# Patient Record
Sex: Female | Born: 2005
Health system: Southern US, Community
[De-identification: ages and names within clinical notes are randomized; demographics above are authoritative.]

---
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
## 2023-02-20 ENCOUNTER — Ambulatory Visit
Admission: EM | Admit: 2023-02-20 | Discharge: 2023-02-20 | Disposition: A | Discharge status: Home / Self Care | Payer: BC Managed Care – PPO | Attending: Urgent Care | Admitting: Urgent Care

## 2023-02-20 DIAGNOSIS — J029 Acute pharyngitis, unspecified: Secondary | ICD-10-CM | POA: Diagnosis not present

## 2023-02-20 LAB — POCT RAPID STREP A (OFFICE): Rapid Strep A Screen: NEGATIVE

## 2023-02-20 NOTE — ED Triage Notes (Signed)
Patient to Urgent Care with complaints of sore throat that started two days ago. Reports the left side of her throat has felt swollen and tender to the touch. Has had some post nasal drip. Some body aches.   Denies any known fevers- states she felt warm last night.

## 2023-02-20 NOTE — Discharge Instructions (Addendum)
Follow up here or with your primary care provider if your symptoms are worsening or not improving.     

## 2023-02-20 NOTE — ED Provider Notes (Signed)
Isabella Stephenson    CSN: [id number] Arrival date & time: 02/20/23  1053      History   Chief Complaint Chief Complaint  Patient presents with   Sore Throat    HPI Isabella Stephenson is a 17 y.o. female.    Sore Throat    Presents to urgent care with complaint of sore throat starting 2 days ago.  Reports swelling and tenderness on the left side of her throat.  Endorses some postnasal drip, body aches.  Denies known fevers but felt "warm".  History reviewed. No pertinent past medical history.  Patient Active Problem List   Diagnosis Date Noted   GAD (generalized anxiety disorder) 01/13/2022    History reviewed. No pertinent surgical history.  OB History   No obstetric history on file.      Home Medications    Prior to Admission medications   Medication Sig Start Date End Date Taking? Authorizing Provider  acetaminophen (TYLENOL) 325 MG tablet Take 2 tablets (650 mg total) by mouth every 6 (six) hours as needed for moderate pain. 12/14/21   Jaynee Eagles, PA-C  escitalopram (LEXAPRO) 20 MG tablet Take 1 tablet (20 mg total) by mouth daily. 12/01/22   Jearld Fenton, NP  fluticasone (FLONASE) 50 MCG/ACT nasal spray Place 2 sprays into both nostrils daily. 01/17/22   Melynda Ripple, MD  ibuprofen (ADVIL) 600 MG tablet Take 1 tablet (600 mg total) by mouth every 6 (six) hours as needed. 01/17/22   Melynda Ripple, MD  Ronnette Juniper FE 1/20 1-20 MG-MCG tablet Take 1 tablet by mouth once daily 08/01/22   Jearld Fenton, NP  Probiotic Product (PROBIOTIC-10 PO) Take by mouth.    [provider]    Family History Family History  Problem Relation Age of Onset   Healthy Mother    Healthy Father    Breast cancer Maternal Grandmother        72s   Hypertension Maternal Grandmother    Atrial fibrillation Maternal Grandfather    Healthy Paternal Grandmother    Drug abuse Paternal Grandfather     Social History Social History   Tobacco Use   Smoking status: Never    Smokeless tobacco: Never  Vaping Use   Vaping Use: Never used  Substance Use Topics   Alcohol use: Never   Drug use: Never     Allergies   Cephalosporins, Nitrofurantoin, and Penicillins   Review of Systems Review of Systems   Physical Exam Triage Vital Signs ED Triage Vitals  Enc Vitals Group     BP 02/20/23 1134 100/69     Pulse Rate 02/20/23 1127 87     Resp 02/20/23 1127 18     Temp 02/20/23 1127 98.8 F (37.1 C)     Temp src --      SpO2 02/20/23 1127 96 %     Weight 02/20/23 1131 130 lb (59 kg)     Height 02/20/23 1131 5\' 2"  (1.575 m)     Head Circumference --      Peak Flow --      Pain Score 02/20/23 1127 7     Pain Loc --      Pain Edu? --      Excl. in [place]? --    No data found.  Updated Vital Signs BP 100/69   Pulse 87   Temp 98.8 F (37.1 C)   Resp 18   Ht 5\' 2"  (1.575 m)   Wt 130 lb (59  kg)   LMP 02/11/2023   SpO2 96%   BMI 23.78 kg/m   Visual Acuity Right Eye Distance:   Left Eye Distance:   Bilateral Distance:    Right Eye Near:   Left Eye Near:    Bilateral Near:     Physical Exam Vitals reviewed.  Constitutional:      Appearance: She is well-developed. She is ill-appearing.  HENT:     Mouth/Throat:     Mouth: Mucous membranes are moist.     Pharynx: Posterior oropharyngeal erythema present. No oropharyngeal exudate.     Tonsils: No tonsillar exudate. 2+ on the right. 2+ on the left.  Lymphadenopathy:     Cervical: Cervical adenopathy present.     Left cervical: Superficial cervical adenopathy present.  Skin:    General: Skin is warm and dry.  Neurological:     General: No focal deficit present.     Mental Status: She is alert and oriented to person, place, and time.  Psychiatric:        Mood and Affect: Mood normal.        Behavior: Behavior normal.      UC Treatments / Results  Labs (all labs ordered are listed, but only abnormal results are displayed) Labs Reviewed  POCT RAPID STREP A (OFFICE)     EKG   Radiology No results found.  Procedures Procedures (including critical care time)  Medications Ordered in UC Medications - No data to display  Initial Impression / Assessment and Plan / UC Course  I have reviewed the triage vital signs and the nursing notes.  Pertinent labs & imaging results that were available during my care of the patient were reviewed by me and considered in my medical decision making (see chart for details).   Patient is afebrile here without recent antipyretics. Satting well on room air. Overall is well appearing, well hydrated, without respiratory distress. Pulmonary exam is unremarkable.  Lungs CTAB without wheezing, rhonchi, rales.  Strep is negative.  Symptoms are consistent with an acute viral process.  Recommending supportive care for symptoms.  Patient acknowledges understanding and agreement of treatment plan.   Final Clinical Impressions(s) / UC Diagnoses   Final diagnoses:  None   Discharge Instructions   None    ED Prescriptions   None    PDMP not reviewed this encounter.   Rose Phi, [place] 02/20/23 1151

## 2023-03-14 ENCOUNTER — Encounter: Payer: Self-pay | Admitting: Internal Medicine

## 2023-03-15 NOTE — Telephone Encounter (Signed)
Can we request her records from [facility]

## 2023-03-15 NOTE — Telephone Encounter (Signed)
Please see the MyChart message reply(ies) for my assessment and plan.    This patient gave consent for this Medical Advice Message and is aware that it may result in a bill to their insurance company, as well as the possibility of receiving a bill for a co-payment or deductible. They are an established patient, but are not seeking medical advice exclusively about a problem treated during an in person or video visit in the last seven days. I did not recommend an in person or video visit within seven days of my reply.    I spent a total of 2 minutes cumulative time within 7 days through MyChart messaging.  [place] Frieden, NP   

## 2023-05-10 ENCOUNTER — Encounter: Payer: Self-pay | Admitting: Internal Medicine

## 2023-05-10 ENCOUNTER — Telehealth (INDEPENDENT_AMBULATORY_CARE_PROVIDER_SITE_OTHER): Payer: BC Managed Care – PPO | Admitting: Internal Medicine

## 2023-05-10 DIAGNOSIS — F411 Generalized anxiety disorder: Secondary | ICD-10-CM | POA: Diagnosis not present

## 2023-05-10 NOTE — Assessment & Plan Note (Signed)
Discussed weaning off escitalopram Support offered

## 2023-05-10 NOTE — Patient Instructions (Signed)

## 2023-05-10 NOTE — Progress Notes (Signed)
Virtual Visit via Video Note  I connected with Isabella Stephenson on 05/10/23 at 11:20 AM EDT by a video enabled telemedicine application and verified that I am speaking with the correct person using two identifiers.  Location: Patient: Home Provider: Office  Persons participating in this video call: Nicki Reaper, NP and Wallace Going   I discussed the limitations of evaluation and management by telemedicine and the availability of in person appointments. The patient expressed understanding and agreed to proceed.  History of Present Illness:  Patient would like to discuss current anxiety.  This is currently managed on escitalopram but she does not feel like she needs this anymore.  She would like to wean off of this medication.  She is not currently seeing a therapist.  She denies depression, SI/HI.  No past medical history on file.  Current Outpatient Medications  Medication Sig Dispense Refill   acetaminophen (TYLENOL) 325 MG tablet Take 2 tablets (650 mg total) by mouth every 6 (six) hours as needed for moderate pain. 30 tablet 0   escitalopram (LEXAPRO) 20 MG tablet Take 1 tablet (20 mg total) by mouth daily. 90 tablet 1   fluticasone (FLONASE) 50 MCG/ACT nasal spray Place 2 sprays into both nostrils daily. 16 g 0   ibuprofen (ADVIL) 600 MG tablet Take 1 tablet (600 mg total) by mouth every 6 (six) hours as needed. 30 tablet 0   LARIN FE 1/20 1-20 MG-MCG tablet Take 1 tablet by mouth once daily 84 tablet 3   Probiotic Product (PROBIOTIC-10 PO) Take by mouth.     No current facility-administered medications for this visit.    Allergies  Allergen Reactions   Cephalosporins Hives   Nitrofurantoin Other (See Comments)    Other reaction(s): Other (See Comments) Flu-like symptoms, feels achy Flu-like symptoms, feels achy    Penicillins Hives    Family History  Problem Relation Age of Onset   Healthy Mother    Healthy Father    Breast cancer Maternal Grandmother        29s    Hypertension Maternal Grandmother    Atrial fibrillation Maternal Grandfather    Healthy Paternal Grandmother    Drug abuse Paternal Grandfather     Social History   Socioeconomic History   Marital status: Single    Spouse name: Not on file   Number of children: Not on file   Years of education: Not on file   Highest education level: Not on file  Occupational History   Not on file  Tobacco Use   Smoking status: Never   Smokeless tobacco: Never  Vaping Use   Vaping Use: Never used  Substance and Sexual Activity   Alcohol use: Never   Drug use: Never   Sexual activity: Not on file  Other Topics Concern   Not on file  Social History Narrative   Not on file   Social Determinants of Health   Financial Resource Strain: Not on file  Food Insecurity: Not on file  Transportation Needs: Not on file  Physical Activity: Not on file  Stress: Not on file  Social Connections: Not on file  Intimate Partner Violence: Not on file     Constitutional: Denies fever, malaise, fatigue, headache or abrupt weight changes.  HEENT: Denies eye pain, eye redness, ear pain, ringing in the ears, wax buildup, runny nose, nasal congestion, bloody nose, or sore throat. Respiratory: Denies difficulty breathing, shortness of breath, cough or sputum production.   Cardiovascular: Denies chest pain, chest  tightness, palpitations or swelling in the hands or feet.  Gastrointestinal: Denies abdominal pain, bloating, constipation, diarrhea or blood in the stool.  GU: Denies urgency, frequency, pain with urination, burning sensation, blood in urine, odor or discharge. Musculoskeletal: Denies decrease in range of motion, difficulty with gait, muscle pain or joint pain and swelling.  Skin: Denies redness, rashes, lesions or ulcercations.  Neurological: Denies dizziness, difficulty with memory, difficulty with speech or problems with balance and coordination.  Psych: Patient has a history of anxiety.  Denies  depression, SI/HI.  No other specific complaints in a complete review of systems (except as listed in HPI above).    Observations/Objective:   Wt Readings from Last 3 Encounters:  02/20/23 130 lb (59 kg) (60 %, Z= 0.24)*  12/01/22 132 lb (59.9 kg) (64 %, Z= 0.35)*  06/09/22 127 lb (57.6 kg) (57 %, Z= 0.18)*   * Growth percentiles are based on CDC (Girls, 2-20 Years) data.    General: Appears her stated age, well developed, well nourished in NAD. Pulmonary/Chest: Normal effort. No respiratory distress.  Neurological: Alert and oriented.  Psychiatric: Mood and affect normal. Behavior is normal. Judgment and thought content normal.     BMET    Component Value Date/Time   NA 139 12/14/2021 1702   K 4.0 12/14/2021 1702   CL 105 12/14/2021 1702   CO2 19 (L) 12/14/2021 1702   GLUCOSE 87 12/14/2021 1702   BUN 8 12/14/2021 1702   CREATININE 0.62 12/14/2021 1702   CALCIUM 9.5 12/14/2021 1702    Lipid Panel  No results found for: "CHOL", "TRIG", "HDL", "CHOLHDL", "VLDL", "LDLCALC"  CBC    Component Value Date/Time   WBC 8.9 12/14/2021 1702   WBC 6.8 10/29/2020 1546   RBC 4.51 12/14/2021 1702   RBC 4.52 10/29/2020 1546   HGB 13.8 12/14/2021 1702   HCT 40.3 12/14/2021 1702   PLT 336 12/14/2021 1702   MCV 89 12/14/2021 1702   MCH 30.6 12/14/2021 1702   MCH 30.5 10/29/2020 1546   MCHC 34.2 12/14/2021 1702   MCHC 34.6 10/29/2020 1546   RDW 11.9 12/14/2021 1702    Hgb A1C No results found for: "HGBA1C"     Assessment and Plan:   Schedule an appointment for annual exam  Follow Up Instructions:    I discussed the assessment and treatment plan with the patient. The patient was provided an opportunity to ask questions and all were answered. The patient agreed with the plan and demonstrated an understanding of the instructions.   The patient was advised to call back or seek an in-person evaluation if the symptoms worsen or if the condition fails to improve as  anticipated.    Nicki Reaper, NP

## 2023-07-04 ENCOUNTER — Other Ambulatory Visit: Payer: Self-pay | Admitting: Internal Medicine

## 2023-07-05 NOTE — Telephone Encounter (Signed)
Rx 08/02/23 #84 [id number]- too soon Requested Prescriptions  Pending Prescriptions Disp Refills   LARIN FE 1/20 1-20 MG-MCG tablet [Pharmacy Med Name: Randall Hiss 1/20 1-20 MG-MCG Oral Tablet] 84 tablet 0    Sig: Take 1 tablet by mouth once daily     OB/GYN:  Contraceptives Passed - 07/04/2023  6:41 AM      Passed - Last BP in normal range    BP Readings from Last 1 Encounters:  02/20/23 100/69         Passed - Valid encounter within last 12 months    Recent Outpatient Visits           1 month ago GAD (generalized anxiety disorder)   [place] [facility] [place], Salvadore [place], NP   7 months ago GAD (generalized anxiety disorder)   [place] [facility] [place], Salvadore Oxford, NP   1 year ago Nevus of female breast   [place] [facility] [place], Salvadore Oxford, NP   1 year ago GAD (generalized anxiety disorder)   [place] [facility] [place], Salvadore Oxford, NP   1 year ago Encounter for routine child health examination without abnormal findings   [place] [facility] [place], Salvadore Oxford, [place]              Passed - Patient is not a smoker

## 2023-07-07 ENCOUNTER — Encounter: Payer: Self-pay | Admitting: Internal Medicine

## 2023-07-09 MED ORDER — LARIN FE 1/20 1-20 MG-MCG PO TABS: 1.0000 | ORAL_TABLET | Freq: Every day | ORAL | 1 refills | Status: DC

## 2023-09-06 ENCOUNTER — Encounter: Payer: Self-pay | Admitting: Internal Medicine

## 2023-11-21 ENCOUNTER — Other Ambulatory Visit: Payer: Self-pay | Admitting: Medical Genetics

## 2023-11-23 ENCOUNTER — Ambulatory Visit (INDEPENDENT_AMBULATORY_CARE_PROVIDER_SITE_OTHER): Payer: BC Managed Care – PPO | Admitting: Internal Medicine

## 2023-11-23 ENCOUNTER — Encounter: Payer: Self-pay | Admitting: Internal Medicine

## 2023-11-23 VITALS — BP 102/68 | Ht 62.0 in | Wt 133.4 lb

## 2023-11-23 DIAGNOSIS — Z0000 Encounter for general adult medical examination without abnormal findings: Secondary | ICD-10-CM | POA: Diagnosis not present

## 2023-11-23 NOTE — Patient Instructions (Signed)

## 2023-11-23 NOTE — Progress Notes (Signed)
 Subjective:    Patient ID: Isabella Stephenson, female    DOB: 11-Jul-2006, 18 y.o.   MRN: [id number]  HPI  Patient presents to clinic today for her annual exam.  Flu: 09/2023 Tetanus: 10/2016 COVID: X 2 Dentist: biannually  Diet: She does eat meat. She consumes fruits and veggies. She does eat some fried foods. She drinks mostly water. Exercise: walks  Review of Systems   No past medical history on file.  Current Outpatient Medications  Medication Sig Dispense Refill   acetaminophen  (TYLENOL ) 325 MG tablet Take 2 tablets (650 mg total) by mouth every 6 (six) hours as needed for moderate pain. 30 tablet 0   fluticasone  (FLONASE ) 50 MCG/ACT nasal spray Place 2 sprays into both nostrils daily. 16 g 0   ibuprofen  (ADVIL ) 600 MG tablet Take 1 tablet (600 mg total) by mouth every 6 (six) hours as needed. 30 tablet 0   LARIN  FE 1/20 1-20 MG-MCG tablet Take 1 tablet by mouth daily. 84 tablet 1   Probiotic Product (PROBIOTIC-10 PO) Take by mouth.     No current facility-administered medications for this visit.    Allergies  Allergen Reactions   Cephalosporins Hives   Nitrofurantoin Other (See Comments)    Other reaction(s): Other (See Comments) Flu-like symptoms, feels achy Flu-like symptoms, feels achy    Penicillins Hives    Family History  Problem Relation Age of Onset   Healthy Mother    Healthy Father    Breast cancer Maternal Grandmother        61s   Hypertension Maternal Grandmother    Atrial fibrillation Maternal Grandfather    Healthy Paternal Grandmother    Drug abuse Paternal Grandfather     Social History   Socioeconomic History   Marital status: Significant Other    Spouse name: Not on file   Number of children: Not on file   Years of education: Not on file   Highest education level: Not on file  Occupational History   Not on file  Tobacco Use   Smoking status: Never   Smokeless tobacco: Never  Vaping Use   Vaping status: Never Used  Substance  and Sexual Activity   Alcohol use: Never   Drug use: Never   Sexual activity: Not on file  Other Topics Concern   Not on file  Social History Narrative   Not on file   Social Drivers of Health   Financial Resource Strain: Not on file  Food Insecurity: Not on file  Transportation Needs: Not on file  Physical Activity: Not on file  Stress: Not on file  Social Connections: Not on file  Intimate Partner Violence: Not on file     Constitutional: Denies fever, malaise, fatigue, headache or abrupt weight changes.  HEENT: Denies eye pain, eye redness, ear pain, ringing in the ears, wax buildup, runny nose, nasal congestion, bloody nose, or sore throat. Respiratory: Denies difficulty breathing, shortness of breath, cough or sputum production.   Cardiovascular: Denies chest pain, chest tightness, palpitations or swelling in the hands or feet.  Gastrointestinal: Denies abdominal pain, bloating, constipation, diarrhea or blood in the stool.  GU: Denies urgency, frequency, pain with urination, burning sensation, blood in urine, odor or discharge. Musculoskeletal: Denies decrease in range of motion, difficulty with gait, muscle pain or joint pain and swelling.  Skin: Pt reports bump of scalp. Denies redness, rashes, or ulcercations.  Neurological: Denies dizziness, difficulty with memory, difficulty with speech or problems with balance and coordination.  Psych: Patient has a history of anxiety.  Denies depression, SI/HI.  No other specific complaints in a complete review of systems (except as listed in HPI above).      Objective:   Physical Exam  BP 102/68 (BP Location: Left Arm, Patient Position: Sitting, Cuff Size: Normal)   Ht 5' 2 (1.575 m)   Wt 133 lb 6.4 oz (60.5 kg)   LMP 11/13/2023 (Approximate)   BMI 24.40 kg/m   Wt Readings from Last 3 Encounters:  02/20/23 130 lb (59 kg) (60%, Z= 0.24)*  12/01/22 132 lb (59.9 kg) (64%, Z= 0.35)*  06/09/22 127 lb (57.6 kg) (57%, Z=  0.18)*   * Growth percentiles are based on CDC (Girls, 2-20 Years) data.    General: Appears her stated age, well developed, well nourished in NAD. Skin: Warm, dry and intact. No rashes, lesions or ulcerations noted. Cyst noted under the scalp at the hairline on the right frontal aspect of the scalp. HEENT: Head: normal shape and size; Eyes: sclera white, no icterus, conjunctiva pink, PERRLA and EOMs intact.  Neck:  Neck supple, trachea midline. No masses, lumps or thyromegaly present.  Cardiovascular: Normal rate and rhythm. S1,S2 noted.  No murmur, rubs or gallops noted. No JVD or BLE edema.  Pulmonary/Chest: Normal effort and positive vesicular breath sounds. No respiratory distress. No wheezes, rales or ronchi noted.  Abdomen: Soft and nontender. Normal bowel sounds.  Musculoskeletal: Strength 5/5 BUE/BLE. No difficulty with gait.  Neurological: Alert and oriented. Cranial nerves II-XII grossly intact. Coordination normal.  Psychiatric: Mood and affect normal. Behavior is normal. Judgment and thought content normal.    BMET    Component Value Date/Time   NA 139 12/14/2021 1702   K 4.0 12/14/2021 1702   CL 105 12/14/2021 1702   CO2 19 (L) 12/14/2021 1702   GLUCOSE 87 12/14/2021 1702   BUN 8 12/14/2021 1702   CREATININE 0.62 12/14/2021 1702   CALCIUM 9.5 12/14/2021 1702    Lipid Panel  No results found for: CHOL, TRIG, HDL, CHOLHDL, VLDL, LDLCALC  CBC    Component Value Date/Time   WBC 8.9 12/14/2021 1702   WBC 6.8 10/29/2020 1546   RBC 4.51 12/14/2021 1702   RBC 4.52 10/29/2020 1546   HGB 13.8 12/14/2021 1702   HCT 40.3 12/14/2021 1702   PLT 336 12/14/2021 1702   MCV 89 12/14/2021 1702   MCH 30.6 12/14/2021 1702   MCH 30.5 10/29/2020 1546   MCHC 34.2 12/14/2021 1702   MCHC 34.6 10/29/2020 1546   RDW 11.9 12/14/2021 1702    Hgb A1C No results found for: HGBA1C          Assessment & Plan:    Preventative health maintenance:  Flu shot  UTD Tetanus UTD Encouraged her to get her COVID booster Encouraged her to consume a balanced diet and exercise regimen Advised her to see an eye doctor and dentist annually She declines labs today  RTC in 1 year, sooner if needed Angeline Laura, NP

## 2023-12-18 ENCOUNTER — Telehealth: Payer: BC Managed Care – PPO

## 2023-12-18 ENCOUNTER — Encounter: Payer: Self-pay | Admitting: Family Medicine

## 2023-12-18 DIAGNOSIS — R6889 Other general symptoms and signs: Secondary | ICD-10-CM | POA: Diagnosis not present

## 2023-12-18 MED ORDER — OSELTAMIVIR PHOSPHATE 75 MG PO CAPS: 75.0000 mg | ORAL_CAPSULE | Freq: Two times a day (BID) | ORAL | 0 refills | Status: AC

## 2023-12-18 NOTE — Patient Instructions (Addendum)
Isabella Stephenson, thank you for joining Freddy Finner, NP for today's virtual visit.  While this provider is not your primary care provider (PCP), if your PCP is located in our provider database this encounter information will be shared with them immediately following your visit.   A [facility] MyChart account gives you access to today's visit and all your visits, tests, and labs performed at [facility] " click here if you don't have a [facility] MyChart account or go to [email or web address]  Consent: (Patient) Isabella Stephenson provided verbal consent for this virtual visit at the beginning of the encounter.  Current Medications:  Current Outpatient Medications:    oseltamivir (TAMIFLU) 75 MG capsule, Take 1 capsule (75 mg total) by mouth 2 (two) times daily for 5 days., Disp: 10 capsule, Rfl: 0   acetaminophen (TYLENOL) 325 MG tablet, Take 2 tablets (650 mg total) by mouth every 6 (six) hours as needed for moderate pain., Disp: 30 tablet, Rfl: 0   fluticasone (FLONASE) 50 MCG/ACT nasal spray, Place 2 sprays into both nostrils daily., Disp: 16 g, Rfl: 0   ibuprofen (ADVIL) 600 MG tablet, Take 1 tablet (600 mg total) by mouth every 6 (six) hours as needed., Disp: 30 tablet, Rfl: 0   LARIN FE 1/20 1-20 MG-MCG tablet, Take 1 tablet by mouth daily., Disp: 84 tablet, Rfl: 1   Probiotic Product (PROBIOTIC-10 PO), Take by mouth., Disp: , Rfl:    Medications ordered in this encounter:  Meds ordered this encounter  Medications   oseltamivir (TAMIFLU) 75 MG capsule    Sig: Take 1 capsule (75 mg total) by mouth 2 (two) times daily for 5 days.    Dispense:  10 capsule    Refill:  0    Supervising Provider:   Merrilee Jansky [[id number]]     *If you need refills on other medications prior to your next appointment, please contact your pharmacy*  Follow-Up: Call back or seek an in-person evaluation if the symptoms worsen or if the condition fails to improve as  anticipated.   Virtual Care [phone]  Other Instructions  Influenza, Adult Influenza is also called the flu. It's an infection that affects your respiratory tract. This includes your nose, throat, windpipe, and lungs. The flu is contagious. This means it spreads easily from person to person. It causes symptoms that are like a cold. It can also cause a high fever and body aches. What are the causes? The flu is caused by the influenza virus. You can get it by: Breathing in droplets that are in the air after an infected person coughs or sneezes. Touching something that has the virus on it and then touching your mouth, nose, or eyes. What increases the risk? You may be more likely to get the flu if: You don't wash your hands often. You're near a lot of people during cold and flu season. You touch your mouth, eyes, or nose without washing your hands first. You don't get a flu shot each year. You may also be more at risk for the flu and serious problems, such as a lung infection called pneumonia, if: You're older than 65. You're pregnant. Your immune system is weak. Your immune system is your body's defense system. You have a long-term, or chronic, condition, such as: Heart, kidney, or lung disease. Diabetes. A liver disorder. Asthma. You're very overweight. You have anemia. This is when you don't have enough red blood cells in your body. What are  the signs or symptoms? Flu symptoms often start all of a sudden. They may last 4-14 days and include: Fever and chills. Headaches, body aches, or muscle aches. Sore throat. Cough. Runny or stuffy nose. Discomfort in your chest. Not wanting to eat as much as normal. Feeling weak or tired. Feeling dizzy. Nausea or vomiting. How is this diagnosed? The flu may be diagnosed based on your symptoms and medical history. You may also have a physical exam. A swab may be taken from your nose or throat and tested for the  virus. How is this treated? If the flu is found early, you can be treated with antiviral medicine. This may be given to you by mouth or through an IV. It can help you feel less sick and get better faster. Taking care of yourself at home can also help your symptoms get better. Your health care provider may tell you to: Take over-the-counter medicines. Drink lots of fluids. The flu often goes away on its own. If you have very bad symptoms or problems caused by the flu, you may need to be treated in a hospital. Follow these instructions at home: Activity Rest as needed. Get lots of sleep. Stay home from work or school as told by your provider. Leave home only to go see your provider. Do not leave home for other reasons until you don't have a fever for 24 hours without taking medicine. Eating and drinking Take an oral rehydration solution (ORS). This is a drink that is sold at pharmacies and stores. Drink enough fluid to keep your pee pale yellow. Try to drink small amounts of clear fluids. These include water, ice chips, fruit juice mixed with water, and low-calorie sports drinks. Try to eat bland foods that are easy to digest. These include bananas, applesauce, rice, lean meats, toast, and crackers. Avoid drinks that have a lot of sugar or caffeine in them. These include energy drinks, regular sports drinks, and soda. Do not drink alcohol. Do not eat spicy or fatty foods. General instructions     Take your medicines only as told by your provider. Use a cool mist humidifier to add moisture to the air in your home. This can make it easier for you to breathe. You should also clean the humidifier every day. To do so: Empty the water. Pour clean water in. Cover your mouth and nose when you cough or sneeze. Wash your hands with soap and water often and for at least 20 seconds. It's extra important to do so after you cough or sneeze. If you can't use soap and water, use hand sanitizer. How is  this prevented?  Get a flu shot every year. Ask your provider when you should get your flu shot. Stay away from people who are sick during fall and winter. Fall and winter are cold and flu season. Contact a health care provider if: You get new symptoms. You have chest pain. You have watery poop, also called diarrhea. You have a fever. Your cough gets worse. You start to have more mucus. You feel like you may vomit, or you vomit. Get help right away if: You become short of breath or have trouble breathing. Your skin or nails turn blue. You have very bad pain or stiffness in your neck. You get a sudden headache or pain in your face or ear. You vomit each time you eat or drink. These symptoms may be an emergency. Call 911 right away. Do not wait to see if the symptoms  will go away. Do not drive yourself to the hospital. This information is not intended to replace advice given to you by your health care provider. Make sure you discuss any questions you have with your health care provider. Document Revised: 08/09/2023 Document Reviewed: 12/14/2022 Elsevier Patient Education  2024 Elsevier Inc.   If you have been instructed to have an in-person evaluation today at a local Urgent Care facility, please use the link below. It will take you to a list of all of our available [facility], including address, phone number and hours of operation. Please do not delay care.  [facility]  If you or a family member do not have a primary care provider, use the link below to schedule a visit and establish care. When you choose a [facility] primary care physician or advanced practice provider, you gain a long-term partner in health. Find a Primary Care Provider  Learn more about [facility] in-office and virtual care options:  - Get Care Now

## 2023-12-18 NOTE — Progress Notes (Signed)
Virtual Visit Consent   Isabella Stephenson, you are scheduled for a virtual visit with a [facility] provider today. Just as with appointments in the office, your consent must be obtained to participate. Your consent will be active for this visit and any virtual visit you may have with one of our providers in the next 365 days. If you have a MyChart account, a copy of this consent can be sent to you electronically.  As this is a virtual visit, video technology does not allow for your provider to perform a traditional examination. This may limit your provider's ability to fully assess your condition. If your provider identifies any concerns that need to be evaluated in person or the need to arrange testing (such as labs, EKG, etc.), we will make arrangements to do so. Although advances in technology are sophisticated, we cannot ensure that it will always work on either your end or our end. If the connection with a video visit is poor, the visit may have to be switched to a telephone visit. With either a video or telephone visit, we are not always able to ensure that we have a secure connection.  By engaging in this virtual visit, you consent to the provision of healthcare and authorize for your insurance to be billed (if applicable) for the services provided during this visit. Depending on your insurance coverage, you may receive a charge related to this service.  I need to obtain your verbal consent now. Are you willing to proceed with your visit today? Isabella Stephenson has provided verbal consent on 12/18/2023 for a virtual visit (video or telephone). Freddy Finner, NP  Date: 12/18/2023 9:01 AM  Virtual Visit via Video Note   I, Freddy Finner, connected with  Isabella Stephenson  ([id number], [birth year]) on 12/18/23 at  9:00 AM EST by a video-enabled telemedicine application and verified that I am speaking with the correct person using two identifiers.  Location: Patient: Virtual Visit Location Patient:  Home Provider: Virtual Visit Location Provider: Home Office   I discussed the limitations of evaluation and management by telemedicine and the availability of in person appointments. The patient expressed understanding and agreed to proceed.    History of Present Illness: Isabella Stephenson is a 18 y.o. who identifies as a female who was assigned female at birth, and is being seen today for congestion  Onset was Sunday night congestion, headache, bodyaches, cough, and feeling tired  Associated symptoms are feeling feverish Modifying factors are theraflu Denies chest pain, shortness of breath  Exposure to sick contacts- known with flu + friend over weekend COVID test: no  Vaccines: FLu utd    Problems:  Patient Active Problem List   Diagnosis Date Noted   GAD (generalized anxiety disorder) 01/13/2022    Allergies:  Allergies  Allergen Reactions   Cephalosporins Hives   Nitrofurantoin Other (See Comments)    Other reaction(s): Other (See Comments) Flu-like symptoms, feels achy Flu-like symptoms, feels achy    Penicillins Hives   Medications:  Current Outpatient Medications:    acetaminophen (TYLENOL) 325 MG tablet, Take 2 tablets (650 mg total) by mouth every 6 (six) hours as needed for moderate pain., Disp: 30 tablet, Rfl: 0   fluticasone (FLONASE) 50 MCG/ACT nasal spray, Place 2 sprays into both nostrils daily., Disp: 16 g, Rfl: 0   ibuprofen (ADVIL) 600 MG tablet, Take 1 tablet (600 mg total) by mouth every 6 (six) hours as needed., Disp: 30 tablet, Rfl: 0  LARIN FE 1/20 1-20 MG-MCG tablet, Take 1 tablet by mouth daily., Disp: 84 tablet, Rfl: 1   Probiotic Product (PROBIOTIC-10 PO), Take by mouth., Disp: , Rfl:   Observations/Objective: Patient is well-developed, well-nourished in no acute distress.  Resting comfortably  at home.  Head is normocephalic, atraumatic.  No labored breathing.  Speech is clear and coherent with logical content.  Patient is alert and  oriented at baseline.  Congestion noted   Assessment and Plan:  1. Flu-like symptoms (Primary)  - oseltamivir (TAMIFLU) 75 MG capsule; Take 1 capsule (75 mg total) by mouth 2 (two) times daily for 5 days.  Dispense: 10 capsule; Refill: 0  - Continue OTC symptomatic management of choice  - Take prescribed medications as directed - Push fluids - Rest as needed - Discussed return precautions and when to seek in-person evaluation,  -Class note provided    Reviewed side effects, risks and benefits of medication.    Patient acknowledged agreement and understanding of the plan.   Past Medical, Surgical, Social History, Allergies, and Medications have been Reviewed.    Follow Up Instructions: I discussed the assessment and treatment plan with the patient. The patient was provided an opportunity to ask questions and all were answered. The patient agreed with the plan and demonstrated an understanding of the instructions.  A copy of instructions were sent to the patient via MyChart unless otherwise noted below.    The patient was advised to call back or seek an in-person evaluation if the symptoms worsen or if the condition fails to improve as anticipated.    Freddy Finner, NP

## 2023-12-22 ENCOUNTER — Other Ambulatory Visit: Payer: Self-pay | Admitting: Internal Medicine

## 2023-12-24 ENCOUNTER — Encounter: Payer: Self-pay | Admitting: Internal Medicine

## 2023-12-24 ENCOUNTER — Other Ambulatory Visit: Payer: Self-pay

## 2023-12-24 MED ORDER — LARIN FE 1/20 1-20 MG-MCG PO TABS: 1.0000 | ORAL_TABLET | Freq: Every day | ORAL | 1 refills | Status: DC

## 2023-12-24 NOTE — Telephone Encounter (Signed)
Refused Larin Fe because it's already been signed and sent.    This is a duplicate

## 2024-04-06 ENCOUNTER — Encounter: Payer: Self-pay | Admitting: Internal Medicine

## 2024-04-06 DIAGNOSIS — R239 Unspecified skin changes: Secondary | ICD-10-CM

## 2024-04-16 ENCOUNTER — Encounter: Payer: Self-pay | Admitting: Physician Assistant

## 2024-04-16 ENCOUNTER — Ambulatory Visit (INDEPENDENT_AMBULATORY_CARE_PROVIDER_SITE_OTHER): Admitting: Physician Assistant

## 2024-04-16 VITALS — BP 84/66 | HR 83

## 2024-04-16 DIAGNOSIS — L814 Other melanin hyperpigmentation: Secondary | ICD-10-CM

## 2024-04-16 DIAGNOSIS — D229 Melanocytic nevi, unspecified: Secondary | ICD-10-CM | POA: Diagnosis not present

## 2024-04-16 DIAGNOSIS — L7211 Pilar cyst: Secondary | ICD-10-CM

## 2024-04-16 DIAGNOSIS — D1801 Hemangioma of skin and subcutaneous tissue: Secondary | ICD-10-CM

## 2024-04-16 DIAGNOSIS — L578 Other skin changes due to chronic exposure to nonionizing radiation: Secondary | ICD-10-CM | POA: Diagnosis not present

## 2024-04-16 DIAGNOSIS — Z1283 Encounter for screening for malignant neoplasm of skin: Secondary | ICD-10-CM | POA: Diagnosis not present

## 2024-04-16 DIAGNOSIS — W908XXA Exposure to other nonionizing radiation, initial encounter: Secondary | ICD-10-CM

## 2024-04-16 NOTE — Progress Notes (Signed)
   New Patient Visit   Subjective  Isabella Stephenson is a 18 y.o. female who presents for the following:  Total Body Skin Exam (TBSE)  Patient present today for new patient visit for TBSE.The patient denies she has spots, moles and lesions to be evaluated, some may be new or changing and the patient may have concern these could be cancer. Patient has not previously been treated by a dermatologist.Patient reports she does not have hx of bx. Patient admits to family history of skin cancers. Patient reports throughout her lifetime has had moderate sun exposure. Currently, patient reports if she has excessive sun exposure, she does apply sunscreen and/or wears protective coverings.  The following portions of the chart were reviewed this encounter and updated as appropriate: medications, allergies, medical history  Review of Systems:  No other skin or systemic complaints except as noted in HPI or Assessment and Plan.  Objective  Well appearing patient in no apparent distress; mood and affect are within normal limits.  A full examination was performed including scalp, head, eyes, ears, nose, lips, neck, chest, axillae, abdomen, back, buttocks, bilateral upper extremities, bilateral lower extremities, hands, feet, fingers, toes, fingernails, and toenails. All findings within normal limits unless otherwise noted below.   Relevant exam findings are noted in the Assessment and Plan.   Assessment & Plan   LENTIGINES, Cherry angiomas, Benign normal skin lesions - Benign-appearing - Call for any changes  MELANOCYTIC NEVI - Tan-brown and/or pink-flesh-colored symmetric macules and papules - Benign appearing on exam today - Observation - Call clinic for new or changing moles - Recommend daily use of broad spectrum spf 30+ sunscreen to sun-exposed areas.   MILD ACTINIC DAMAGE - Chronic condition, secondary to cumulative UV/sun exposure - diffuse scaly erythematous macules with underlying  dyspigmentation - Recommend daily broad spectrum sunscreen SPF 30+ to sun-exposed areas, reapply every 2 hours as needed.  - Staying in the shade or wearing long sleeves, sun glasses (UVA+UVB protection) and wide brim hats (4-inch brim around the entire circumference of the hat) are also recommended for sun protection.  - Call for new or changing lesions.  Pilar Cyst Exam: Subcutaneous nodule.   Benign-appearing. Exam most consistent with a pilar cyst. Discussed that a cyst is a benign growth that can grow over time and sometimes get irritated or inflamed. Recommend observation if it is not bothersome. Discussed option of surgical excision to remove it if it is growing, symptomatic, or other changes noted. Please call for new or changing lesions so they can be evaluated.   SKIN CANCER SCREENING PERFORMED TODAY MULTIPLE BENIGN NEVI   LENTIGINES   CHERRY ANGIOMA   ACTINIC SKIN DAMAGE   PILAR CYST   SCREENING EXAM FOR SKIN CANCER    Return in about 1 year (around 04/16/2025) for TBSE.   Documentation: I have reviewed the above documentation for accuracy and completeness, and I agree with the above.  Leotha Voeltz K, PA-C

## 2024-04-16 NOTE — Patient Instructions (Signed)

## 2024-05-28 ENCOUNTER — Other Ambulatory Visit: Payer: Self-pay | Admitting: Internal Medicine

## 2024-05-30 NOTE — Telephone Encounter (Signed)
 Requested Prescriptions  Pending Prescriptions Disp Refills   LARIN  FE 1/20 1-20 MG-MCG tablet [Pharmacy Med Name: Larin  Fe 1/20 1-20 MG-MCG Oral Tablet] 84 tablet 1    Sig: Take 1 tablet by mouth once daily     OB/GYN:  Contraceptives Failed - 05/30/2024 10:23 AM      Failed - Last BP in normal range    BP Readings from Last 1 Encounters:  04/16/24 (!) 84/66         Failed - Valid encounter within last 12 months    Recent Outpatient Visits   None            Passed - Patient is not a smoker

## 2024-06-30 ENCOUNTER — Encounter: Payer: Self-pay | Admitting: Internal Medicine

## 2024-07-25 ENCOUNTER — Other Ambulatory Visit: Payer: Self-pay | Admitting: Internal Medicine

## 2024-07-28 NOTE — Telephone Encounter (Signed)
 Too soon for refill,LRF 05/30/24 for 84 and 1 RF.  Requested Prescriptions  Pending Prescriptions Disp Refills   LARIN  FE 1/20 1-20 MG-MCG tablet [Pharmacy Med Name: Larin  Fe 1/20 1-20 MG-MCG Oral Tablet] 28 tablet 0    Sig: Take 1 tablet by mouth once daily     OB/GYN:  Contraceptives Failed - 07/28/2024  9:52 AM      Failed - Last BP in normal range    BP Readings from Last 1 Encounters:  04/16/24 (!) 84/66         Failed - Valid encounter within last 12 months    Recent Outpatient Visits   None            Passed - Patient is not a smoker

## 2024-09-03 ENCOUNTER — Other Ambulatory Visit: Payer: Self-pay | Admitting: Medical Genetics

## 2024-09-03 DIAGNOSIS — Z006 Encounter for examination for normal comparison and control in clinical research program: Secondary | ICD-10-CM

## 2024-11-05 ENCOUNTER — Other Ambulatory Visit: Payer: Self-pay | Admitting: Internal Medicine

## 2024-11-07 NOTE — Telephone Encounter (Signed)
 OFFICE VISIT NEEDED FOR ADDITIONAL REFILLS   Requested Prescriptions  Pending Prescriptions Disp Refills   LARIN  FE 1/20 1-20 MG-MCG tablet [Pharmacy Med Name: Larin  Fe 1/20 1-20 MG-MCG Oral Tablet] 28 tablet 0    Sig: Take 1 tablet by mouth once daily     OB/GYN:  Contraceptives Failed - 11/07/2024  2:13 PM      Failed - Last BP in normal range    BP Readings from Last 1 Encounters:  04/16/24 (!) 84/66         Failed - Valid encounter within last 12 months    Recent Outpatient Visits   None            Passed - Patient is not a smoker

## 2024-11-13 ENCOUNTER — Encounter: Payer: Self-pay | Admitting: Internal Medicine

## 2024-11-14 ENCOUNTER — Other Ambulatory Visit: Payer: Self-pay

## 2024-11-14 MED ORDER — LARIN FE 1/20 1-20 MG-MCG PO TABS: 1.0000 | ORAL_TABLET | Freq: Every day | ORAL | 0 refills | Status: AC

## 2025-04-20 ENCOUNTER — Ambulatory Visit: Admitting: Physician Assistant
